# Patient Record
Sex: Female | Born: 1975 | Race: White | Hispanic: No | Marital: Single | Smoking: Current every day smoker
Health system: Southern US, Community
[De-identification: ages and names within clinical notes are randomized; demographics above are authoritative.]

## PROBLEM LIST (undated history)

## (undated) DIAGNOSIS — E876 Hypokalemia: Secondary | ICD-10-CM

## (undated) DIAGNOSIS — R202 Paresthesia of skin: Secondary | ICD-10-CM

## (undated) DIAGNOSIS — R200 Anesthesia of skin: Secondary | ICD-10-CM

## (undated) HISTORY — PX: TUBAL LIGATION: SHX77

## (undated) HISTORY — PX: ABDOMINAL HYSTERECTOMY: SHX81

---

## 2014-01-20 ENCOUNTER — Inpatient Hospital Stay (HOSPITAL_COMMUNITY)
Admission: AD | Admit: 2014-01-20 | Discharge: 2014-01-22 | DRG: 372 | Disposition: A | Discharge status: Home / Self Care | Payer: Self-pay | Source: Other Acute Inpatient Hospital | Attending: Internal Medicine | Admitting: Internal Medicine

## 2014-01-20 ENCOUNTER — Encounter (HOSPITAL_COMMUNITY): Payer: Self-pay | Admitting: *Deleted

## 2014-01-20 DIAGNOSIS — K838 Other specified diseases of biliary tract: Secondary | ICD-10-CM | POA: Diagnosis present

## 2014-01-20 DIAGNOSIS — K658 Other peritonitis: Principal | ICD-10-CM | POA: Diagnosis present

## 2014-01-20 DIAGNOSIS — F17200 Nicotine dependence, unspecified, uncomplicated: Secondary | ICD-10-CM | POA: Diagnosis present

## 2014-01-20 DIAGNOSIS — K929 Disease of digestive system, unspecified: Secondary | ICD-10-CM | POA: Diagnosis present

## 2014-01-20 DIAGNOSIS — R1011 Right upper quadrant pain: Secondary | ICD-10-CM

## 2014-01-20 DIAGNOSIS — R0902 Hypoxemia: Secondary | ICD-10-CM | POA: Diagnosis present

## 2014-01-20 DIAGNOSIS — K839 Disease of biliary tract, unspecified: Secondary | ICD-10-CM

## 2014-01-20 DIAGNOSIS — R932 Abnormal findings on diagnostic imaging of liver and biliary tract: Secondary | ICD-10-CM

## 2014-01-20 DIAGNOSIS — Y836 Removal of other organ (partial) (total) as the cause of abnormal reaction of the patient, or of later complication, without mention of misadventure at the time of the procedure: Secondary | ICD-10-CM | POA: Diagnosis present

## 2014-01-20 LAB — TROPONIN I: Troponin I: <0.3 ng/mL (ref <0.30)

## 2014-01-20 MED ORDER — ENOXAPARIN SODIUM 40 MG/0.4ML ~~LOC~~ SOLN
40.0000 mg | SUBCUTANEOUS | Status: DC
  Administered 2014-01-20 – 2014-01-21 (×2): 40 mg via SUBCUTANEOUS
  Filled 2014-01-20 (×3): qty 0.4

## 2014-01-20 MED ORDER — ONDANSETRON HCL 4 MG/2ML IJ SOLN
4.0000 mg | Freq: Four times a day (QID) | INTRAMUSCULAR | Status: DC | PRN
  Filled 2014-01-20: qty 2

## 2014-01-20 MED ORDER — HYDROMORPHONE HCL PF 1 MG/ML IJ SOLN
2.0000 mg | INTRAMUSCULAR | Status: DC | PRN
  Administered 2014-01-20 – 2014-01-21 (×3): 2 mg via INTRAVENOUS
  Administered 2014-01-21: 1 mg via INTRAVENOUS
  Administered 2014-01-21: 2 mg via INTRAVENOUS
  Filled 2014-01-20 (×7): qty 2

## 2014-01-20 MED ORDER — PIPERACILLIN-TAZOBACTAM 3.375 G IVPB
3.3750 g | Freq: Three times a day (TID) | INTRAVENOUS | Status: DC
  Administered 2014-01-20 – 2014-01-22 (×5): 3.375 g via INTRAVENOUS
  Filled 2014-01-20 (×8): qty 50

## 2014-01-20 MED ORDER — CETYLPYRIDINIUM CHLORIDE 0.05 % MT LIQD
7.0000 mL | Freq: Two times a day (BID) | OROMUCOSAL | Status: DC
  Administered 2014-01-21 – 2014-01-22 (×2): 7 mL via OROMUCOSAL

## 2014-01-20 MED ORDER — SODIUM CHLORIDE 0.9 % IV SOLN
INTRAVENOUS | Status: DC
  Administered 2014-01-20 – 2014-01-21 (×2): via INTRAVENOUS

## 2014-01-20 NOTE — Consult Note (Signed)
   Consultation   History of Present Illness:  This is a 38 yo WF  3 days  Post lap chole for symptomatic [facility]lithiasis at [facility], who presented  1 day after the discharge with recurrent RUQ abd.pain radiating into right shoulder. Nausea. No vomiting.She was evaluated at ED with findings of normal LFT's, WBC 21.000 and positive HIDA scan consistent with a generous bile leak along the right pericolic space. Because of no GI coverage on the weekends, she was [facility]/stent placement.She denies hx of hepatitis, jaundice.   No past medical history on file.no f hx of gall bladder disease No past surgical history on file.s/p lap chole 01/18/2014 smoker family history is not on file. No Known Allergies      Review of Systems:  The remainder of the 10 point ROS is negative except as outlined in H&P, rash upper extremities and lower legs   Physical Exam: General appearance  Well developed,obese, in mild distress., pain when she lay es down Eyes- non icteric. HEENT nontraumatic, normocephalic. Mouth no lesions, tongue papillated, no cheilosis. Neck supple without adenopathy, thyroid not enlarged, no carotid bruits, no JVD. Lungs Clear to auscultation bilaterally. Cor normal S1, normal S2,rapid  regular rhythm, no murmur,  quiet precordium. Abdomen: obese, soft, but positive rebound RUQ, decreased bowl sounds Rectal:not done Extremities no pedal edema. Skin excoriated rash both lower and upper extremities. Neurological alert and oriented x 3., in pain Psychological normal mood and affect.  Assessment and Plan: I have previewed the CT scan ,preoperative ultrasound and postoperative HIDA scan, which confirm post cholecystectomy bile leak of moderate size, causing bile peritonitis. Unfortunately it took 6 hours for the patient to [facility]  and arrived after the scheduled time in OR for ERCP/biliary stent placement under general anesthesia at  6.00 pm. We have rescheduled her procedure for 7.30 am in OR by Dt Outlaw, who has been briefed on the case. Continue antibiotics, pain control, bowl rest Morbid obesity Elevated WBC likely secondary to chemical peritonitis, r/o biloma, will have to watch for subhepatic  abscess, may need percutaneous drain to decompress bile collection.  I have personally reviewed the HIDA scan with Dr Kearney Hardover      01/20/2014 Kimberly Sarora Kimberly Sandoval

## 2014-01-20 NOTE — Progress Notes (Signed)
ANTIBIOTIC CONSULT NOTE - INITIAL  Pharmacy Consult for Zosyn Indication: intra-abdominal infection  No Known Allergies  Patient Measurements: Height: 5\' 4"  (162.6 cm) Weight: 222 lb 11.2 oz (101.016 kg) IBW/kg (Calculated) : 54.7  Vital Signs: Temp: 98.7 F (37.1 C) (08/15 1857) Temp src: Oral (08/15 1857) BP: 127/80 mmHg (08/15 1857) Pulse Rate: 95 (08/15 1857) Intake/Output from previous day:   Intake/Output from this shift:    Labs: No results found for this basename: WBC, HGB, PLT, LABCREA, CREATININE,  in the last 72 hours CrCl is unknown because no creatinine reading has been taken. No results found for this basename: VANCOTROUGH, VANCOPEAK, VANCORANDOM, GENTTROUGH, GENTPEAK, GENTRANDOM, TOBRATROUGH, TOBRAPEAK, TOBRARND, AMIKACINPEAK, AMIKACINTROU, AMIKACIN,  in the last 72 hours   Microbiology: No results found for this or any previous visit (from the past 720 hour(s)).  Medical History: No past medical history on file.  Medications:  Prescriptions prior to admission  Medication Sig Dispense Refill  . cephALEXin (KEFLEX) 500 MG capsule Take 500 mg by mouth 3 (three) times daily. For 7 days. Starting 01/19/2014      . HYDROcodone-acetaminophen (NORCO/VICODIN) 5-325 MG per tablet Take 1 tablet by mouth every 4 (four) hours as needed for moderate pain.      Marland Kitchen. Morphine Sulfate, PF, 10 MG/ML SOLN Inject 0.2 mLs into the vein every 2 (two) hours as needed (pain).      Marland Kitchen. olopatadine (PATANOL) 0.1 % ophthalmic solution Place 1 drop into the left eye every 3 (three) hours.       Assessment: 38 yo F [facility] and discharged home later that same day.  The next day she developed intense abd pain and [facility] and [facility]  CT scan there showed a RUQ pericolic fluid collection.  She is also s/p HIDA scan which showed large bile leak.  Pt was transferred [facility] for ERCP and stenting.  Labs from [facility]  report WBC 20K and normal SCr.  Goal of Therapy:  Eradication of infection  Renal dose adjustment of antibiotics  Plan:  Zosyn 3.375 gm IV q8h (4 hour infusion).   Toys 'R' UsKimberly Harris Penton, Pharm.D., BCPS Clinical Pharmacist Pager [phone] 01/20/2014 7:24 PM

## 2014-01-20 NOTE — Progress Notes (Signed)
MD notified that patient is having new type of pain rated at 10/10 in mid chest.  VS obtained.  MD ordered EKG, pain medicine, and cyclic troponins.  Patient RN made aware.  Will continue to monitor patient.

## 2014-01-20 NOTE — H&P (Signed)
[facility] History and Physical  Kimberly BooksVictoria Sandoval ZOX:[id number] DOB: [birth year] DOA: 01/20/2014  Referring physician:[facility] MD PCP: No PCP Per Patient   Chief Complaint: abd pain  HPI: Kimberly BooksVictoria Sandoval is a 38 y.o. female underwent Lap [facility] and was discharged home later the same day. She did have some abd soreness at discharge as expected. Yesterday started having severe RUQ abd pain that was extremely intense and went [facility] and [facility], she was noted to have some RUQ pericolic fluid collection on CT. Today she had a HIDA scan which showed a large bile leak and after discussion with GI, she was transferred her for ERCP and stenting. She has no other PMH and her complaints at this time include abd pain and nausea. Labs from [facility] with leukocytosis WBC of 20K    Review of Systems: positives bolded Constitutional:  No weight loss, night sweats, Fevers, chills, fatigue.  HEENT:  No headaches, Difficulty swallowing,Tooth/dental problems,Sore throat,  No sneezing, itching, ear ache, nasal congestion, post nasal drip,  Cardio-vascular:  No chest pain, Orthopnea, PND, swelling in lower extremities, anasarca, dizziness, palpitations  GI:  No heartburn, indigestion, abdominal pain, nausea, vomiting, diarrhea, change in bowel habits, loss of appetite  Resp:  No shortness of breath with exertion or at rest. No excess mucus, no productive cough, No non-productive cough, No coughing up of blood.No change in color of mucus.No wheezing.No chest wall deformity  Skin:  no rash or lesions.  GU:  no dysuria, change in color of urine, no urgency or frequency. No flank pain.  Musculoskeletal:  No joint pain or swelling. No decreased range of motion. No back pain.  Psych:  No change in mood or affect. No depression or anxiety. No memory loss.   No past medical history on file. No past surgical history on  file. Social History:  Smokes cigarettes, no alcohol, and drug history on file.  Allergies not on file  No family history on file.   Prior to Admission medications   Not on File   Physical Exam: Filed Vitals:   01/20/14 1802  Pulse: 95  TempSrc: Oral    Wt Readings from Last 3 Encounters:  No data found for Wt    General:  AAOx3, uncomfortable appearing Eyes: PERRL, normal lids, irises & conjunctiva ENT: grossly normal lips & tongue Neck: no LAD, masses or thyromegaly Cardiovascular: RRR, no m/r/g. No LE edema. Respiratory: CTA bilaterally, no w/r/r. Normal respiratory effort. Abdomen: obese, tender RUQ, no BS appreciated, no rigidity, mild rebound Skin: no rash or induration seen on limited exam Musculoskeletal: grossly normal tone BUE/BLE Psychiatric: grossly normal mood and affect, speech fluent and appropriate Neurologic: grossly non-focal.          Labs on Admission:  Basic Metabolic Panel: No results found for this basename: NA, K, CL, CO2, GLUCOSE, BUN, CREATININE, CALCIUM, MG, PHOS,  in the last 168 hours Liver Function Tests: No results found for this basename: AST, ALT, ALKPHOS, BILITOT, PROT, ALBUMIN,  in the last 168 hours No results found for this basename: LIPASE, AMYLASE,  in the last 168 hours No results found for this basename: AMMONIA,  in the last 168 hours CBC: No results found for this basename: WBC, NEUTROABS, HGB, HCT, MCV, PLT,  in the last 168 hours Cardiac Enzymes: No results found for this basename: CKTOTAL, CKMB, CKMBINDEX, TROPONINI,  in the last 168 hours  BNP (last 3 results) No results found for this  basename: PROBNP,  in the last 8760 hours CBG: No results found for this basename: GLUCAP,  in the last 168 hours  Radiological Exams on Admission: No results found.  Assessment/Plan Active Problems:   Abdominal pain, right upper quadrant   -s/p cholecystectomy 8/13   -Bile leak on HIDA at [facility] today   -GI consulted for ERCP  and stent in am (7:30am)   -NPO, pain control, IVF, Zosyn    Tobacco use disorder -counseled  Code Status: Full code DVT Prophylaxis: lovenox Family Communication: none at bedside Disposition Plan:  Home pending improvement  Time spent:  [facility] Pager [phone]  **Disclaimer: This note may have been dictated with voice recognition software. Similar sounding words can inadvertently be transcribed and this note may contain transcription errors which may not have been corrected upon publication of note.**

## 2014-01-20 NOTE — Progress Notes (Addendum)
Pt with complaints of chest pain rating 10/10. Describes pain as sharp and stabbing. Pt states pain radiating to left side.  Pt states sitting up helps to make her pain better. VS being taken and EKG being done.  Too soon to give pain medicine. Awaiting call back from physician on call. Spoke with provider on call Viyouh. Pt to get an EKG and cycle troponin q6 x 3. Pt states pain improved with sitting up.

## 2014-01-20 NOTE — Progress Notes (Signed)
[facility] arrived [facility]. Alert and oriented.  Patient oriented to unit  And falls protocol. Md paged traid [facility] admission paged to notify of patient arrival.

## 2014-01-21 ENCOUNTER — Encounter (HOSPITAL_COMMUNITY)
Admission: AD | Disposition: A | Discharge status: Home / Self Care | Payer: Self-pay | Source: Other Acute Inpatient Hospital | Attending: Internal Medicine

## 2014-01-21 ENCOUNTER — Inpatient Hospital Stay (HOSPITAL_COMMUNITY): Payer: Self-pay | Admitting: Anesthesiology

## 2014-01-21 ENCOUNTER — Encounter (HOSPITAL_COMMUNITY): Payer: Self-pay

## 2014-01-21 ENCOUNTER — Inpatient Hospital Stay (HOSPITAL_COMMUNITY): Payer: Self-pay

## 2014-01-21 ENCOUNTER — Encounter (HOSPITAL_COMMUNITY): Payer: Self-pay | Admitting: Anesthesiology

## 2014-01-21 HISTORY — PX: ERCP: SHX5425

## 2014-01-21 LAB — COMPREHENSIVE METABOLIC PANEL
ALBUMIN: 2.8 g/dL — AB (ref 3.5–5.2)
ALK PHOS: 83 U/L (ref 39–117)
ALT: 20 U/L (ref 0–35)
ANION GAP: 10 (ref 5–15)
AST: 16 U/L (ref 0–37)
BILIRUBIN TOTAL: 1 mg/dL (ref 0.3–1.2)
BUN: 7 mg/dL (ref 6–23)
CHLORIDE: 100 meq/L (ref 96–112)
CO2: 27 mEq/L (ref 19–32)
Calcium: 8.7 mg/dL (ref 8.4–10.5)
Creatinine, Ser: 0.6 mg/dL (ref 0.50–1.10)
GFR calc Af Amer: >90 mL/min (ref >90)
GFR calc non Af Amer: >90 mL/min (ref >90)
Glucose, Bld: 108 mg/dL — ABNORMAL HIGH (ref 70–99)
POTASSIUM: 4.2 meq/L (ref 3.7–5.3)
SODIUM: 137 meq/L (ref 137–147)
TOTAL PROTEIN: 6.8 g/dL (ref 6.0–8.3)

## 2014-01-21 LAB — PROTIME-INR
INR: 1.16 (ref 0.00–1.49)
PROTHROMBIN TIME: 14.8 s (ref 11.6–15.2)

## 2014-01-21 LAB — CBC
HCT: 38.4 % (ref 36.0–46.0)
HEMOGLOBIN: 11.9 g/dL — AB (ref 12.0–15.0)
MCH: 28.7 pg (ref 26.0–34.0)
MCHC: 31 g/dL (ref 30.0–36.0)
MCV: 92.8 fL (ref 78.0–100.0)
PLATELETS: 405 10*3/uL — AB (ref 150–400)
RBC: 4.14 MIL/uL (ref 3.87–5.11)
RDW: 15.7 % — ABNORMAL HIGH (ref 11.5–15.5)
WBC: 16.4 10*3/uL — AB (ref 4.0–10.5)

## 2014-01-21 LAB — TROPONIN I: Troponin I: <0.3 ng/mL (ref <0.30)

## 2014-01-21 SURGERY — ERCP, WITH INTERVENTION IF INDICATED
Anesthesia: General

## 2014-01-21 MED ORDER — MIDAZOLAM HCL 2 MG/2ML IJ SOLN
INTRAMUSCULAR | Status: DC | PRN
  Administered 2014-01-21: 2 mg via INTRAVENOUS

## 2014-01-21 MED ORDER — ONDANSETRON HCL 4 MG/2ML IJ SOLN
INTRAMUSCULAR | Status: DC | PRN
  Administered 2014-01-21: 4 mg via INTRAVENOUS

## 2014-01-21 MED ORDER — OXYCODONE HCL 5 MG/5ML PO SOLN: 5.0000 mg | Freq: Once | ORAL | Status: AC | PRN

## 2014-01-21 MED ORDER — SODIUM CHLORIDE 0.9 % IV SOLN: INTRAVENOUS | Status: DC

## 2014-01-21 MED ORDER — GLYCOPYRROLATE 0.2 MG/ML IJ SOLN
INTRAMUSCULAR | Status: DC | PRN
  Administered 2014-01-21: 0.6 mg via INTRAVENOUS

## 2014-01-21 MED ORDER — PROPOFOL 10 MG/ML IV BOLUS
INTRAVENOUS | Status: DC | PRN
  Administered 2014-01-21: 130 mg via INTRAVENOUS

## 2014-01-21 MED ORDER — ROCURONIUM BROMIDE 100 MG/10ML IV SOLN
INTRAVENOUS | Status: DC | PRN
  Administered 2014-01-21: 25 mg via INTRAVENOUS

## 2014-01-21 MED ORDER — OXYCODONE HCL 5 MG PO TABS
5.0000 mg | ORAL_TABLET | Freq: Once | ORAL | Status: AC | PRN
  Administered 2014-01-21: 5 mg via ORAL
  Filled 2014-01-21: qty 1

## 2014-01-21 MED ORDER — LIDOCAINE HCL (CARDIAC) 20 MG/ML IV SOLN
INTRAVENOUS | Status: DC | PRN
  Administered 2014-01-21: 60 mg via INTRAVENOUS

## 2014-01-21 MED ORDER — HYDROMORPHONE HCL PF 1 MG/ML IJ SOLN: 0.2500 mg | INTRAMUSCULAR | Status: DC | PRN

## 2014-01-21 MED ORDER — NEOSTIGMINE METHYLSULFATE 10 MG/10ML IV SOLN
INTRAVENOUS | Status: DC | PRN
  Administered 2014-01-21: 3 mg via INTRAVENOUS

## 2014-01-21 MED ORDER — LACTATED RINGERS IV SOLN
INTRAVENOUS | Status: DC | PRN
  Administered 2014-01-21: 08:00:00 via INTRAVENOUS

## 2014-01-21 MED ORDER — MIDAZOLAM HCL 2 MG/2ML IJ SOLN
INTRAMUSCULAR | Status: AC
  Filled 2014-01-21: qty 2

## 2014-01-21 MED ORDER — FENTANYL CITRATE 0.05 MG/ML IJ SOLN
INTRAMUSCULAR | Status: AC
  Filled 2014-01-21: qty 5

## 2014-01-21 MED ORDER — FENTANYL CITRATE 0.05 MG/ML IJ SOLN
INTRAMUSCULAR | Status: DC | PRN
  Administered 2014-01-21: 100 ug via INTRAVENOUS
  Administered 2014-01-21: 150 ug via INTRAVENOUS

## 2014-01-21 MED ORDER — SUCCINYLCHOLINE CHLORIDE 20 MG/ML IJ SOLN
INTRAMUSCULAR | Status: DC | PRN
  Administered 2014-01-21: 100 mg via INTRAVENOUS

## 2014-01-21 MED ORDER — IOHEXOL 300 MG/ML  SOLN
INTRAMUSCULAR | Status: DC | PRN
  Administered 2014-01-21: 08:00:00

## 2014-01-21 NOTE — Anesthesia Postprocedure Evaluation (Signed)
  Anesthesia Post-op Note  Patient: Kimberly BooksVictoria Sandoval  Procedure(s) Performed: Procedure(s): ENDOSCOPIC RETROGRADE CHOLANGIOPANCREATOGRAPHY (ERCP) (N/A)  Patient Location: PACU  Anesthesia Type:General  Level of Consciousness: awake and alert   Airway and Oxygen Therapy: Patient Spontanous Breathing  Post-op Pain: none  Post-op Assessment: Post-op Vital signs reviewed, Patient's Cardiovascular Status Stable and Respiratory Function Stable  Post-op Vital Signs: Reviewed  Filed Vitals:   01/21/14 0909  BP: 114/60  Pulse: 78  Temp:   Resp: 21    Complications: No apparent anesthesia complications

## 2014-01-21 NOTE — Anesthesia Procedure Notes (Signed)
Procedure Name: Intubation Date/Time: 01/21/2014 7:51 AM Performed by: Alanda AmassFRIEDMAN, Daniela Hernan A Pre-anesthesia Checklist: Patient identified, Patient being monitored, Timeout performed, Emergency Drugs available and Suction available Patient Re-evaluated:Patient Re-evaluated prior to inductionOxygen Delivery Method: Circle system utilized Preoxygenation: Pre-oxygenation with 100% oxygen Intubation Type: IV induction Ventilation: Mask ventilation without difficulty Laryngoscope Size: Mac and 3 Grade View: Grade I Tube type: Oral Tube size: 7.5 mm Number of attempts: 1 Airway Equipment and Method: Stylet Placement Confirmation: ETT inserted through vocal cords under direct vision,  breath sounds checked- equal and bilateral and positive ETCO2 Secured at: 21 cm Tube secured with: Tape Dental Injury: Teeth and Oropharynx as per pre-operative assessment

## 2014-01-21 NOTE — Progress Notes (Signed)
Jennings BooksVictoria Tibbett 7:40 AM  Subjective: Patient seen and examined and photocopied notes reviewed and currently her pain is under okay control and no new complaints  Objective: Vital signs stable afebrile no acute distress exam pertinent for no leaking from scars but upper abdominal discomfort left greater than right nontender lower other exam please see pre-assessment evaluation labs reviewed increased CT and HIDA scan reports reviewed  Assessment: Bile leak status post laparoscopic cholecystectomy  Plan: The risks benefits methods and success rate of ERCP with stent placement was discussed with the patient and will proceed this morning with anesthesia assistance with further workup and plans pending those findings  [facility],Narissa Beaufort E

## 2014-01-21 NOTE — Anesthesia Preprocedure Evaluation (Addendum)
Anesthesia Evaluation  Patient identified by MRN, date of birth, ID band Patient awake    Reviewed: Allergy & Precautions, H&P , NPO status , Patient's Chart, lab work & pertinent test results  Airway Mallampati: II TM Distance: >3 FB Neck ROM: Full    Dental no notable dental hx. (+) Poor Dentition, Dental Advisory Given, Edentulous Upper   Pulmonary Current Smoker,  breath sounds clear to auscultation  Pulmonary exam normal       Cardiovascular negative cardio ROS  Rhythm:Regular Rate:Normal     Neuro/Psych negative neurological ROS  negative psych ROS   GI/Hepatic negative GI ROS, Neg liver ROS,   Endo/Other  negative endocrine ROS  Renal/GU negative Renal ROS  negative genitourinary   Musculoskeletal   Abdominal   Peds  Hematology negative hematology ROS (+)   Anesthesia Other Findings   Reproductive/Obstetrics negative OB ROS                          Anesthesia Physical Anesthesia Plan  ASA: II  Anesthesia Plan: General   Post-op Pain Management:    Induction: Intravenous  Airway Management Planned: Oral ETT  Additional Equipment:   Intra-op Plan:   Post-operative Plan: Extubation in OR  Informed Consent: I have reviewed the patients History and Physical, chart, labs and discussed the procedure including the risks, benefits and alternatives for the proposed anesthesia with the patient or authorized representative who has indicated his/her understanding and acceptance.   Dental advisory given  Plan Discussed with: CRNA  Anesthesia Plan Comments:         Anesthesia Quick Evaluation

## 2014-01-21 NOTE — Transfer of Care (Signed)
Immediate Anesthesia Transfer of Care Note  Patient: Kimberly BooksVictoria Sandoval  Procedure(s) Performed: Procedure(s): ENDOSCOPIC RETROGRADE CHOLANGIOPANCREATOGRAPHY (ERCP) (N/A)  Patient Location: PACU  Anesthesia Type:General  Level of Consciousness: awake and alert   Airway & Oxygen Therapy: Patient Spontanous Breathing and Patient connected to face mask oxygen  Post-op Assessment: Report given to PACU RN and Post -op Vital signs reviewed and stable  Post vital signs: Reviewed and stable  Complications: No apparent anesthesia complications

## 2014-01-21 NOTE — Progress Notes (Signed)
[facility] PROGRESS NOTE  Kimberly BooksVictoria Sandoval [id number] DOB: 18-Dec-1975 DOA: 01/20/2014 PCP: No PCP Per Patient  Assessment/Plan: Bile Leak s/p cholecystectomy 8/13  -s/p ERCP and stenting today -clears, empiric Zosyn, IVF -Cmet, CBC in am -improving   Hypoxia -smokes 1-2PPD x20years -suspect splinting/atx due to abd pain -check CXR -Wean O2   Tobacco use disorder  -counseled  DVT proph: lovenox  Code Status: Full Code Family Communication: none at bedside Disposition Plan: home when stable   Consultants:  GI  Procedures:  ERCP and stent  HPI/Subjective: abd pain better, now on 4L O2 from PACU  Objective: Filed Vitals:   01/21/14 1013  BP: 111/74  Pulse: 101  Temp: 97.5 F (36.4 C)  Resp: 22    Intake/Output Summary (Last 24 hours) at 01/21/14 1124 Last data filed at 01/21/14 0829  Gross per 24 hour  Intake   1510 ml  Output      0 ml  Net   1510 ml   Filed Weights   01/20/14 1857  Weight: 101.016 kg (222 lb 11.2 oz)    Exam:   General:  AAOx3, no distress  Cardiovascular: S1S2/RRR  Respiratory: crackles at bases, poor air movement  Abdomen: soft, obese, RUQ tender, BS diminished but present  Musculoskeletal: no edema c/c  Data Reviewed: Basic Metabolic Panel:  Recent Labs Lab 01/21/14 0600  NA 137  K 4.2  CL 100  CO2 27  GLUCOSE 108*  BUN 7  CREATININE 0.60  CALCIUM 8.7   Liver Function Tests:  Recent Labs Lab 01/21/14 0600  AST 16  ALT 20  ALKPHOS 83  BILITOT 1.0  PROT 6.8  ALBUMIN 2.8*   No results found for this basename: LIPASE, AMYLASE,  in the last 168 hours No results found for this basename: AMMONIA,  in the last 168 hours CBC:  Recent Labs Lab 01/21/14 0600  WBC 16.4*  HGB 11.9*  HCT 38.4  MCV 92.8  PLT 405*   Cardiac Enzymes:  Recent Labs Lab 01/20/14 2200 01/21/14 0600  TROPONINI <0.30 <0.30   BNP (last 3 results) No results found for this basename: PROBNP,  in the last 8760  hours CBG: No results found for this basename: GLUCAP,  in the last 168 hours  No results found for this or any previous visit (from the past 240 hour(s)).   Studies: Dg Ercp Biliary & Pancreatic Ducts  01/21/2014   CLINICAL DATA:  Common bile duct leak  EXAM: ERCP  TECHNIQUE: Multiple spot images obtained with the fluoroscopic device and submitted for interpretation post-procedure.  COMPARISON:  None.  FINDINGS: Images document cannulation of the common bile duct and placement of a common bile duct stent.  IMPRESSION: See above.  These images were submitted for radiologic interpretation only. Please see the procedural report for the amount of contrast and the fluoroscopy time utilized.   Electronically Signed   By: Maryclare BeanArt  Hoss M.D.   On: 01/21/2014 09:05    Scheduled Meds: . antiseptic oral rinse  7 mL Mouth Rinse BID  . enoxaparin (LOVENOX) injection  40 mg Subcutaneous Q24H  . piperacillin-tazobactam (ZOSYN)  IV  3.375 g Intravenous 3 times per day   Continuous Infusions: . sodium chloride 100 mL/hr at 01/20/14 1923   Antibiotics Given (last 72 hours)   Date/Time Action Medication Dose Rate   01/20/14 2214 Given   piperacillin-tazobactam (ZOSYN) IVPB 3.375 g 3.375 g 12.5 mL/hr   01/21/14 0548 Given   piperacillin-tazobactam (ZOSYN) IVPB 3.375 g  3.375 g 12.5 mL/hr      Active Problems:   Abdominal pain, right upper quadrant   Bile leak   Tobacco use disorder   Nonspecific (abnormal) findings on radiological and other examination of biliary tract    Time spent:    [facility]  [facility] Pager [phone]. If 7PM-7AM, please contact night-coverage at [email or web address], password [facility] 01/21/2014, 11:24 AM  LOS: 1 day

## 2014-01-21 NOTE — Progress Notes (Signed)
Pr underwent ERCP/shincterotomy and stent placement by DR Magod this morning. I will try to locate the report under Procedures.

## 2014-01-22 ENCOUNTER — Encounter (HOSPITAL_COMMUNITY): Payer: Self-pay | Admitting: Gastroenterology

## 2014-01-22 LAB — CBC WITH DIFFERENTIAL/PLATELET
BASOS PCT: 0 % (ref 0–1)
Basophils Absolute: 0 10*3/uL (ref 0.0–0.1)
EOS ABS: 0.5 10*3/uL (ref 0.0–0.7)
EOS PCT: 3 % (ref 0–5)
HEMATOCRIT: 33.2 % — AB (ref 36.0–46.0)
Hemoglobin: 10.4 g/dL — ABNORMAL LOW (ref 12.0–15.0)
Lymphocytes Relative: 16 % (ref 12–46)
Lymphs Abs: 2.5 10*3/uL (ref 0.7–4.0)
MCH: 28.4 pg (ref 26.0–34.0)
MCHC: 31.3 g/dL (ref 30.0–36.0)
MCV: 90.7 fL (ref 78.0–100.0)
MONO ABS: 0.8 10*3/uL (ref 0.1–1.0)
Monocytes Relative: 5 % (ref 3–12)
Neutro Abs: 11.9 10*3/uL — ABNORMAL HIGH (ref 1.7–7.7)
Neutrophils Relative %: 76 % (ref 43–77)
Platelets: 399 10*3/uL (ref 150–400)
RBC: 3.66 MIL/uL — ABNORMAL LOW (ref 3.87–5.11)
RDW: 15.5 % (ref 11.5–15.5)
WBC: 15.7 10*3/uL — ABNORMAL HIGH (ref 4.0–10.5)

## 2014-01-22 LAB — COMPREHENSIVE METABOLIC PANEL
ALBUMIN: 2.6 g/dL — AB (ref 3.5–5.2)
ALT: 14 U/L (ref 0–35)
ANION GAP: 9 (ref 5–15)
AST: 12 U/L (ref 0–37)
Alkaline Phosphatase: 75 U/L (ref 39–117)
BILIRUBIN TOTAL: 0.8 mg/dL (ref 0.3–1.2)
BUN: 6 mg/dL (ref 6–23)
CO2: 28 mEq/L (ref 19–32)
CREATININE: 0.6 mg/dL (ref 0.50–1.10)
Calcium: 8.5 mg/dL (ref 8.4–10.5)
Chloride: 100 mEq/L (ref 96–112)
GFR calc Af Amer: >90 mL/min (ref >90)
GFR calc non Af Amer: >90 mL/min (ref >90)
Glucose, Bld: 92 mg/dL (ref 70–99)
Potassium: 3.8 mEq/L (ref 3.7–5.3)
Sodium: 137 mEq/L (ref 137–147)
TOTAL PROTEIN: 6.4 g/dL (ref 6.0–8.3)

## 2014-01-22 MED ORDER — CIPROFLOXACIN HCL 500 MG PO TABS: 500.0000 mg | ORAL_TABLET | Freq: Two times a day (BID) | ORAL | Status: DC

## 2014-01-22 MED ORDER — METRONIDAZOLE 500 MG PO TABS: 500.0000 mg | ORAL_TABLET | Freq: Three times a day (TID) | ORAL | Status: DC

## 2014-01-22 NOTE — Progress Notes (Signed)
Kimberly Sandoval 11:00 AM  Subjective: Patient doing significantly better and only hurts around her incisions and is tolerating soft solid and is moving her bowels and has no new complaints and no obvious post procedural complications  Objective: Vital signs stable afebrile no acute distress doing obviously significantly better abdomen is soft and much less tender only over incision labs improved  Assessment: Status post ERCP for bile leak doing much better  Plan: Okay with me to go home and have surgeon recheck labs locally on followup and since she lives out of town I will see her back when necessary and she will call my nurse to set up stent removal which we discussed in roughly 2-3 months and I'm happy to see her back if need be and I gave her my card [facility],Kimberly Sandoval

## 2014-01-22 NOTE — Progress Notes (Signed)
Unassigned patient' Subjective: Events since admission noted. Patient has been feeling much better since she had her ERCP. Tolerated her breakfast today-French toast; denies having any nausea, vomiting or abdominal pain. Had a BM this morning.   Objective: Vital signs in last 24 hours: Temp:  [98.2 F (36.8 C)-98.6 F (37 C)] 98.5 F (36.9 C) (08/17 0529) Pulse Rate:  [90-111] 90 (08/17 0529) Resp:  [19-24] 19 (08/17 0529) BP: (109-113)/(69-73) 110/70 mmHg (08/17 0529) SpO2:  [91 %-96 %] 94 % (08/17 0845) Last BM Date: 01/22/14  Intake/Output from previous day: 08/16 0701 - 08/17 0700 In: 350 [I.V.:350] Out: -  Intake/Output this shift: Total I/O In: 118 [P.O.:118] Out: -   General appearance: alert, cooperative, appears older than stated age, no distress and morbidly obese Resp: clear to auscultation bilaterally Cardio: regular rate and rhythm, S1, S2 normal, no murmur, click, rub or gallop GI: soft, non-tender; bowel sounds normal; no masses,  no organomegaly Extremities: extremities normal, atraumatic, no cyanosis or edema  Lab Results:  Recent Labs  01/21/14 0600 01/22/14 0535  WBC 16.4* 15.7*  HGB 11.9* 10.4*  HCT 38.4 33.2*  PLT 405* 399   BMET  Recent Labs  01/21/14 0600 01/22/14 0535  NA 137 137  K 4.2 3.8  CL 100 100  CO2 27 28  GLUCOSE 108* 92  BUN 7 6  CREATININE 0.60 0.60  CALCIUM 8.7 8.5   LFT  Recent Labs  01/22/14 0535  PROT 6.4  ALBUMIN 2.6*  AST 12  ALT 14  ALKPHOS 75  BILITOT 0.8   PT/INR  Recent Labs  01/21/14 0600  LABPROT 14.8  INR 1.16   Studies/Results: Dg Chest 2 View  01/21/2014   CLINICAL DATA:  Hypoxia.  EXAM: CHEST  2 VIEW  COMPARISON:  None.  FINDINGS: Low lung volumes. There is lung base opacity most likely atelectasis. Infiltrate is possible. There is no pulmonary edema or convincing pleural effusion. No pneumothorax.  Cardiac silhouette is normal in size. Normal mediastinal and hilar contours.  Bony thorax  is unremarkable.  IMPRESSION: Low lung volumes with bilateral lung base opacity, most likely atelectasis. Infiltrate should be considered likely if there are symptoms of pneumonia. No pulmonary edema.   Electronically Signed   By: Amie Portlandavid  Ormond M.D.   On: 01/21/2014 12:43   Dg Ercp Biliary & Pancreatic Ducts  01/21/2014   CLINICAL DATA:  Common bile duct leak  EXAM: ERCP  TECHNIQUE: Multiple spot images obtained with the fluoroscopic device and submitted for interpretation post-procedure.  COMPARISON:  None.  FINDINGS: Images document cannulation of the common bile duct and placement of a common bile duct stent.  IMPRESSION: See above.  These images were submitted for radiologic interpretation only. Please see the procedural report for the amount of contrast and the fluoroscopy time utilized.   Electronically Signed   By: Maryclare BeanArt  Hoss M.D.   On: 01/21/2014 09:05   Medications: I have reviewed the patient's current medications.  Assessment/Plan: *Bile leak s/p cholecystectomy for cholelithiasis: Patient seems to be doing well after her ERCP. She seems ready for discharge. A low fat diet has been advised. She is to follow up with her PCP in 7-10 days.   LOS: 2 days   Holland Nickson 01/22/2014, 11:33 AM

## 2014-01-22 NOTE — Progress Notes (Signed)
PIV removed.  Pt given discharge paperwork and requested to be helped with the cost of medication.  CM assisted pt with paperwork.  Returned to check on pt to assist with discharge to the main entrance, but pt was not in room when checked.  Received phone call that [facility].  Informed caller that pt had been discharged, but did not know she left alone.  [facility].

## 2014-01-22 NOTE — Progress Notes (Signed)
Utilization review completed.  

## 2014-01-22 NOTE — Progress Notes (Signed)
CARE MANAGEMENT NOTE 01/22/2014  Patient:  Kimberly Sandoval,Kimberly Sandoval   Account Number:  [id number]  Date Initiated:  01/22/2014  Documentation [facility]:  [facility]Avner Stroder  Subjective/Objective Assessment:   large bile leak     Action/Plan:   Anticipated DC Date:  01/22/2014   Anticipated DC Plan:  HOME/SELF CARE      DC Planning Services  CM consult  Medication Assistance  MATCH Program      Choice offered to / List presented to:             Status of service:  Completed, signed off Medicare Important Message given?   (If response is "NO", the following Medicare IM given date fields will be blank) Date Medicare IM given:   Medicare IM given by:   Date Additional Medicare IM given:   Additional Medicare IM given by:    Discharge Disposition:  HOME/SELF CARE  Per UR Regulation:    If discussed at Long Length of Stay Meetings, dates discussed:    Comments:  01/22/2014 1600 NCM spoke to pt and states she will not be able to purchase meds. Provided pt with MATCH with copay for meds $3.00. Explained to pt program can be used once per yr. Pt [facility], Dr. Janeece FittingEggleston-Clark. Scheduled appt for 01/29/2014 at 1015 am. Faxed dc summary to Dr. Verlee MonteEggleston-Clark's office, # [phone] and fax [phone]. Isidoro DonningAlesia Miko Markwood RN CCM Case Mgmt phone [phone]

## 2014-01-22 NOTE — Discharge Summary (Signed)
Physician Discharge Summary  Kimberly Sandoval ONG:[id number] DOB: 1975/08/09 DOA: 01/20/2014  PCP: No PCP Per Patient  Admit date: 01/20/2014 Discharge date: 01/22/2014  Time spent: 35 minutes  Recommendations for Outpatient Follow-up:  1. PCP in 1 week 2. Dr.Magod in 2-34months for stent removal  Discharge Diagnoses:  Active Problems:   Abdominal pain, right upper quadrant   Bile leak   Tobacco use disorder   Nonspecific (abnormal) findings on radiological and other examination of biliary tract   Hypoxia  Discharge Condition: stable  Diet recommendation: bland  Filed Weights   01/20/14 1857  Weight: 101.016 kg (222 lb 11.2 oz)    History of present illness:  Kimberly Sandoval is a 38 y.o. female underwent Lap cholecystectomy on 8/13 at [facility] and was discharged home later the same day.  She did have some abd soreness at discharge as expected.  8/14 started having severe RUQ abd pain that was extremely intense and went to [facility] ER and was transferred to [facility], she was noted to have some RUQ pericolic fluid collection on CT.  8/15 she had a HIDA scan which showed a large bile leak and after discussion with GI, she was transferred her for ERCP and stenting.  She has no other PMH and her complaints at this time include abd pain and nausea.  Labs from [place] with leukocytosis WBC of 20K  Hospital Course:  Bile Leak s/p cholecystectomy 8/13  -s/p ERCP and stenting 8/16 -treated with empiric Zosyn, IVF  -improved and advised to Fu with Dr.Magod in 2-85months for stent removal  Hypoxia  -smokes 1-2PPD x20years  -due to splinting/atx due to abd pain  -CXR unremarkable -O2 being weaned off  Tobacco use disorder  -counseled   Procedures: ERCP and stenting  Consultations:  GI  Discharge Exam: Filed Vitals:   01/22/14 0529  BP: 110/70  Pulse: 90  Temp: 98.5 F (36.9 C)  Resp: 19    General: AAOx3 Cardiovascular: S1S2/RRR Respiratory:  CTAB  Discharge Instructions You were cared for by a hospitalist during your hospital stay. If you have any questions about your discharge medications or the care you received while you were in the hospital after you are discharged, you can call the unit and asked to speak with the hospitalist on call if the hospitalist that took care of you is not available. Once you are discharged, your primary care physician will handle any further medical issues. Please note that NO REFILLS for any discharge medications will be authorized once you are discharged, as it is imperative that you return to your primary care physician (or establish a relationship with a primary care physician if you do not have one) for your aftercare needs so that they can reassess your need for medications and monitor your lab values.  Discharge Instructions   Discharge instructions    Complete by:  As directed   Bland diet     Increase activity slowly    Complete by:  As directed             Medication List    STOP taking these medications       cephALEXin 500 MG capsule  Commonly known as:  KEFLEX     Morphine Sulfate (PF) 10 MG/ML Soln     olopatadine 0.1 % ophthalmic solution  Commonly known as:  PATANOL      TAKE these medications       HYDROcodone-acetaminophen 5-325 MG per tablet  Commonly known as:  NORCO/VICODIN  Take 1 tablet by mouth every 4 (four) hours as needed for moderate pain.       No Known Allergies    The results of significant diagnostics from this hospitalization (including imaging, microbiology, ancillary and laboratory) are listed below for reference.    Significant Diagnostic Studies: Dg Chest 2 View  01/21/2014   CLINICAL DATA:  Hypoxia.  EXAM: CHEST  2 VIEW  COMPARISON:  None.  FINDINGS: Low lung volumes. There is lung base opacity most likely atelectasis. Infiltrate is possible. There is no pulmonary edema or convincing pleural effusion. No pneumothorax.  Cardiac silhouette is  normal in size. Normal mediastinal and hilar contours.  Bony thorax is unremarkable.  IMPRESSION: Low lung volumes with bilateral lung base opacity, most likely atelectasis. Infiltrate should be considered likely if there are symptoms of pneumonia. No pulmonary edema.   Electronically Signed   By: Amie Portlandavid  Ormond M.D.   On: 01/21/2014 12:43   Dg Ercp Biliary & Pancreatic Ducts  01/21/2014   CLINICAL DATA:  Common bile duct leak  EXAM: ERCP  TECHNIQUE: Multiple spot images obtained with the fluoroscopic device and submitted for interpretation post-procedure.  COMPARISON:  None.  FINDINGS: Images document cannulation of the common bile duct and placement of a common bile duct stent.  IMPRESSION: See above.  These images were submitted for radiologic interpretation only. Please see the procedural report for the amount of contrast and the fluoroscopy time utilized.   Electronically Signed   By: Maryclare BeanArt  Hoss M.D.   On: 01/21/2014 09:05    Microbiology: No results found for this or any previous visit (from the past 240 hour(s)).   Labs: Basic Metabolic Panel:  Recent Labs Lab 01/21/14 0600 01/22/14 0535  NA 137 137  K 4.2 3.8  CL 100 100  CO2 27 28  GLUCOSE 108* 92  BUN 7 6  CREATININE 0.60 0.60  CALCIUM 8.7 8.5   Liver Function Tests:  Recent Labs Lab 01/21/14 0600 01/22/14 0535  AST 16 12  ALT 20 14  ALKPHOS 83 75  BILITOT 1.0 0.8  PROT 6.8 6.4  ALBUMIN 2.8* 2.6*   No results found for this basename: LIPASE, AMYLASE,  in the last 168 hours No results found for this basename: AMMONIA,  in the last 168 hours CBC:  Recent Labs Lab 01/21/14 0600 01/22/14 0535  WBC 16.4* 15.7*  NEUTROABS  --  11.9*  HGB 11.9* 10.4*  HCT 38.4 33.2*  MCV 92.8 90.7  PLT 405* 399   Cardiac Enzymes:  Recent Labs Lab 01/20/14 2200 01/21/14 0600  TROPONINI <0.30 <0.30   BNP: BNP (last 3 results) No results found for this basename: PROBNP,  in the last 8760 hours CBG: No results found for  this basename: GLUCAP,  in the last 168 hours     Signed:  Antwion Carpenter  [facility] 01/22/2014, 11:55 AM

## 2014-01-22 NOTE — Discharge Instructions (Signed)

## 2014-04-18 ENCOUNTER — Encounter (HOSPITAL_COMMUNITY): Payer: Self-pay | Admitting: *Deleted

## 2014-04-23 ENCOUNTER — Other Ambulatory Visit: Payer: Self-pay | Admitting: Gastroenterology

## 2014-04-23 NOTE — Addendum Note (Signed)
Addended byVida Rigger: Shalayah Beagley on: 04/23/2014 02:54 PM   Modules accepted: Orders

## 2014-04-26 ENCOUNTER — Encounter (HOSPITAL_COMMUNITY): Payer: Self-pay | Admitting: Anesthesiology

## 2014-04-26 NOTE — Progress Notes (Addendum)
Patient can not get a ride is going to reschedule.  I instructed her to call Dr Marlane HatcherMagod's office.

## 2014-04-26 NOTE — Progress Notes (Signed)
I spoke with Dr Marlane HatcherMagod's scheduler   , she reports that she has noted been notified by patient that she is not having procedure.  I called patient again and she again reported that she can not make it tomorrow, I reminded patient to call Dr Marlane HatcherMagod's office, "I will, I just have not had time yet."

## 2014-04-27 ENCOUNTER — Ambulatory Visit (HOSPITAL_COMMUNITY): Admission: RE | Admit: 2014-04-27 | Payer: Self-pay | Source: Ambulatory Visit | Admitting: Gastroenterology

## 2014-04-27 HISTORY — DX: Hypokalemia: E87.6

## 2014-04-27 SURGERY — ESOPHAGOGASTRODUODENOSCOPY (EGD) WITH PROPOFOL
Anesthesia: Monitor Anesthesia Care

## 2014-05-30 ENCOUNTER — Encounter (HOSPITAL_COMMUNITY): Payer: Self-pay | Admitting: *Deleted

## 2014-06-13 ENCOUNTER — Other Ambulatory Visit: Payer: Self-pay | Admitting: Gastroenterology

## 2014-06-13 NOTE — Anesthesia Preprocedure Evaluation (Addendum)
Anesthesia Evaluation  Patient identified by MRN, date of birth, ID band Patient awake    Reviewed: Allergy & Precautions, NPO status , Patient's Chart, lab work & pertinent test results  History of Anesthesia Complications Negative for: history of anesthetic complications  Airway Mallampati: II  TM Distance: >3 FB Neck ROM: Full    Dental no notable dental hx. (+) Dental Advisory Given, Poor Dentition, Missing   Pulmonary Current Smoker,  breath sounds clear to auscultation  Pulmonary exam normal       Cardiovascular negative cardio ROS  Rhythm:Regular Rate:Normal     Neuro/Psych PSYCHIATRIC DISORDERS Anxiety Depression negative neurological ROS     GI/Hepatic negative GI ROS, Neg liver ROS,   Endo/Other  obesity  Renal/GU negative Renal ROS  negative genitourinary   Musculoskeletal negative musculoskeletal ROS (+)   Abdominal   Peds negative pediatric ROS (+)  Hematology negative hematology ROS (+)   Anesthesia Other Findings   Reproductive/Obstetrics negative OB ROS                            Anesthesia Physical Anesthesia Plan  ASA: II  Anesthesia Plan: MAC   Post-op Pain Management:    Induction: Intravenous  Airway Management Planned: Nasal Cannula  Additional Equipment:   Intra-op Plan:   Post-operative Plan: Extubation in OR  Informed Consent: I have reviewed the patients History and Physical, chart, labs and discussed the procedure including the risks, benefits and alternatives for the proposed anesthesia with the patient or authorized representative who has indicated his/her understanding and acceptance.   Dental advisory given  Plan Discussed with: CRNA  Anesthesia Plan Comments:         Anesthesia Quick Evaluation

## 2014-06-14 ENCOUNTER — Encounter (HOSPITAL_COMMUNITY): Payer: Self-pay | Admitting: *Deleted

## 2014-06-14 ENCOUNTER — Ambulatory Visit (HOSPITAL_COMMUNITY)
Admission: RE | Admit: 2014-06-14 | Discharge: 2014-06-14 | Disposition: A | Discharge status: Home / Self Care | Payer: Self-pay | Source: Ambulatory Visit | Attending: Gastroenterology | Admitting: Gastroenterology

## 2014-06-14 ENCOUNTER — Encounter (HOSPITAL_COMMUNITY)
Admission: RE | Disposition: A | Discharge status: Home / Self Care | Payer: Self-pay | Source: Ambulatory Visit | Attending: Gastroenterology

## 2014-06-14 ENCOUNTER — Ambulatory Visit (HOSPITAL_COMMUNITY): Payer: Self-pay | Admitting: Anesthesiology

## 2014-06-14 DIAGNOSIS — Z4689 Encounter for fitting and adjustment of other specified devices: Secondary | ICD-10-CM | POA: Insufficient documentation

## 2014-06-14 HISTORY — DX: Anesthesia of skin: R20.0

## 2014-06-14 HISTORY — DX: Paresthesia of skin: R20.2

## 2014-06-14 HISTORY — PX: ESOPHAGOGASTRODUODENOSCOPY (EGD) WITH PROPOFOL: SHX5813

## 2014-06-14 HISTORY — PX: GASTROINTESTINAL STENT REMOVAL: SHX6384

## 2014-06-14 SURGERY — ESOPHAGOGASTRODUODENOSCOPY (EGD) WITH PROPOFOL
Anesthesia: Monitor Anesthesia Care

## 2014-06-14 MED ORDER — PROPOFOL 10 MG/ML IV BOLUS
INTRAVENOUS | Status: DC | PRN
  Administered 2014-06-14 (×4): 20 mg via INTRAVENOUS

## 2014-06-14 MED ORDER — LIDOCAINE HCL (CARDIAC) 20 MG/ML IV SOLN
INTRAVENOUS | Status: AC
  Filled 2014-06-14: qty 5

## 2014-06-14 MED ORDER — MIDAZOLAM HCL 5 MG/5ML IJ SOLN
INTRAMUSCULAR | Status: DC | PRN
  Administered 2014-06-14: 2 mg via INTRAVENOUS

## 2014-06-14 MED ORDER — PROPOFOL INFUSION 10 MG/ML OPTIME
INTRAVENOUS | Status: DC | PRN
  Administered 2014-06-14: 150 ug/kg/min via INTRAVENOUS

## 2014-06-14 MED ORDER — LACTATED RINGERS IV SOLN
INTRAVENOUS | Status: DC | PRN
  Administered 2014-06-14: 10:00:00 via INTRAVENOUS

## 2014-06-14 MED ORDER — ONDANSETRON HCL 4 MG/2ML IJ SOLN
INTRAMUSCULAR | Status: DC | PRN
  Administered 2014-06-14: 4 mg via INTRAVENOUS

## 2014-06-14 MED ORDER — PROPOFOL 10 MG/ML IV BOLUS
INTRAVENOUS | Status: AC
  Filled 2014-06-14: qty 20

## 2014-06-14 MED ORDER — MIDAZOLAM HCL 2 MG/2ML IJ SOLN
INTRAMUSCULAR | Status: AC
  Filled 2014-06-14: qty 2

## 2014-06-14 MED ORDER — KETAMINE HCL 10 MG/ML IJ SOLN
INTRAMUSCULAR | Status: DC | PRN
  Administered 2014-06-14: 20 mg via INTRAVENOUS

## 2014-06-14 MED ORDER — KETAMINE HCL 10 MG/ML IJ SOLN
INTRAMUSCULAR | Status: AC
  Filled 2014-06-14: qty 1

## 2014-06-14 SURGICAL SUPPLY — 14 items

## 2014-06-14 NOTE — Anesthesia Postprocedure Evaluation (Signed)
  Anesthesia Post-op Note  Patient: Kimberly Sandoval  Procedure(s) Performed: Procedure(s) (LRB): ESOPHAGOGASTRODUODENOSCOPY (EGD) WITH PROPOFOL (N/A) GASTROINTESTINAL STENT REMOVAL (N/A)  Patient Location: PACU  Anesthesia Type: MAC  Level of Consciousness: awake and alert   Airway and Oxygen Therapy: Patient Spontanous Breathing  Post-op Pain: mild  Post-op Assessment: Post-op Vital signs reviewed, Patient's Cardiovascular Status Stable, Respiratory Function Stable, Patent Airway and No signs of Nausea or vomiting  Last Vitals:  Filed Vitals:   06/14/14 1210  BP: 105/52  Pulse: 78  Temp:   Resp: 13    Post-op Vital Signs: stable   Complications: No apparent anesthesia complications

## 2014-06-14 NOTE — Op Note (Signed)
[facility] [address] [place], [id number]   ENDOSCOPY PROCEDURE REPORT  PATIENT: Kimberly Sandoval, Kimberly Sandoval  MR#: #[id number] BIRTHDATE: 1976-03-31 , 38  yrs. old GENDER: female ENDOSCOPIST: Vida RiggerMarc Zoye Chandra, MD REFERRED BY: PROCEDURE DATE:  06/14/2014 PROCEDURE:  EGD w/ fb removal ASA CLASS:     Class II INDICATIONS:  therapeutic procedure. MEDICATIONS: Propofol 300 mg IV and Versed 2 mg IV  ketamine 25 mg TOPICAL ANESTHETIC: Cetacaine Spray  DESCRIPTION OF PROCEDURE: After the risks benefits and alternatives of the procedure were thoroughly explained, informed consent was obtained.  The pentax ercp [id number] endoscope was introduced through the mouth and advanced to the second portion of the duodenum , Without limitations.  The instrument was slowly withdrawn as the mucosa was fully examined.    no obvious abnormalities were seen and the stent was found in the proper position in the ampulla and was grabbed with the snare in the customary fashion and was withdrawn into the scope but when it was partially pulled in the stent broke but the remaining part was secured using the elevator and the scope was removed and the 2 pieces of the stent was recovered and we went ahead and repeated the endoscopy using the side-viewing scope and other than some minimal trauma no other abnormalities were seen and the ampulla was normal       Retroflexed views revealed no abnormalities on quick evaluation.     The scope was then withdrawn from the patient and the procedure completed.  COMPLICATIONS: none ENDOSCOPIC IMPRESSION: 1.     previously placed biliary stent removed with snare as above without any obvious endoscopic findings nor obvious residual stent remaining despite breaking as above  RECOMMENDATIONS:customary post-endoscopic orders and happy to see back when necessary and back to normal activities and diet tomorrow and will begin liquids this afternoon and soft solids later  today if okay  REPEAT EXAM: as needed  eSigned:  Vida RiggerMarc Kirby Argueta, MD 06/14/2014 11:43 AM    CC:  CPT CODES: ICD CODES:  The ICD and CPT codes recommended by this software are interpretations from the data that the clinical staff has captured with the software.  The verification of the translation of this report to the ICD and CPT codes and modifiers is the sole responsibility of the health care institution and practicing physician where this report was generated.  [employer] will not be held responsible for the validity of the ICD and CPT codes included on this report.  AMA assumes no liability for data contained or not contained herein. CPT is a Publishing rights managerregistered trademark of the [employer].

## 2014-06-14 NOTE — Addendum Note (Signed)
Addended byVida Rigger: Zian Mohamed on: 06/14/2014 10:42 AM   Modules accepted: Orders

## 2014-06-14 NOTE — Discharge Instructions (Addendum)
Esophagogastroduodenoscopy Care After Refer to this sheet in the next few weeks. These instructions provide you with information on caring for yourself after your procedure. Your caregiver may also give you more specific instructions. Your treatment has been planned according to current medical practices, but problems sometimes occur. Call your caregiver if you have any problems or questions after your procedure.   HOME CARE INSTRUCTIONS  Do not eat or drink anything until the numbing medicine (local anesthetic) has worn off and your gag reflex has returned. You will know that the local anesthetic has worn off when you can swallow comfortably.  Do not drive for 12 hours after the procedure or as directed by your caregiver.  Only take medicines as directed by your caregiver.   SEEK MEDICAL CARE IF:   You cannot stop coughing.  You are not urinating at all or less than usual.  SEEK IMMEDIATE MEDICAL CARE IF:  You have difficulty swallowing.  You cannot eat or drink.  You have worsening throat or chest pain.  You have dizziness, lightheadedness, or you faint.  You have nausea or vomiting.  You have chills.  You have a fever.  You have severe abdominal pain.  You have black, tarry, or bloody stools. Liquids only until 3:30 PM and then may have soft solids and then if doing well may advance diet tomorrow and return to normal activity tomorrow and call if any of the above or if you have any other GI question or problem

## 2014-06-14 NOTE — Transfer of Care (Signed)
Immediate Anesthesia Transfer of Care Note  Patient: Kimberly Sandoval  Procedure(s) Performed: Procedure(s): ESOPHAGOGASTRODUODENOSCOPY (EGD) WITH PROPOFOL (N/A) GASTROINTESTINAL STENT REMOVAL (N/A)  Patient Location: PACU and Endoscopy Unit  Anesthesia Type:MAC  Level of Consciousness: awake, alert , oriented and patient cooperative  Airway & Oxygen Therapy: Patient Spontanous Breathing and Patient connected to nasal cannula oxygen  Post-op Assessment: Report given to PACU RN, Post -op Vital signs reviewed and stable and Patient moving all extremities  Post vital signs: Reviewed and stable  Complications: No apparent anesthesia complications

## 2014-06-14 NOTE — Progress Notes (Signed)
Jennings BooksVictoria Taras 10:15 AM  Subjective: Patient without any new GI symptoms and no problems since I seen her and has not been back to any other doctors  Objective: Vital signs stable afebrile no acute distress please see preassessment evaluation for her exam  Assessment: Stent placement status post bile leak  Plan: Okay to proceed with endoscopy and stent removal with anesthesia assistance and the procedure and the warnings were discussed including what to watch for at home after the procedure  [facility],Debara Kamphuis E

## 2014-06-15 ENCOUNTER — Encounter (HOSPITAL_COMMUNITY): Payer: Self-pay | Admitting: Gastroenterology

## 2014-10-03 ENCOUNTER — Other Ambulatory Visit: Payer: Self-pay | Admitting: Gastroenterology

## 2014-10-03 DIAGNOSIS — R635 Abnormal weight gain: Secondary | ICD-10-CM

## 2014-10-03 DIAGNOSIS — R109 Unspecified abdominal pain: Secondary | ICD-10-CM

## 2014-10-10 ENCOUNTER — Other Ambulatory Visit: Payer: Self-pay

## 2015-04-03 IMAGING — RF DG ERCP WO/W SPHINCTEROTOMY
1 series · 1 of 1 positions shown · non-contrast
Comparison: None.

CLINICAL DATA: Common bile duct leak

EXAM:
ERCP
TECHNIQUE: Multiple spot images obtained with the fluoroscopic device and
submitted for interpretation post-procedure.

[Series 1: run · 1 of 1 slices shown]
[im 1/1]
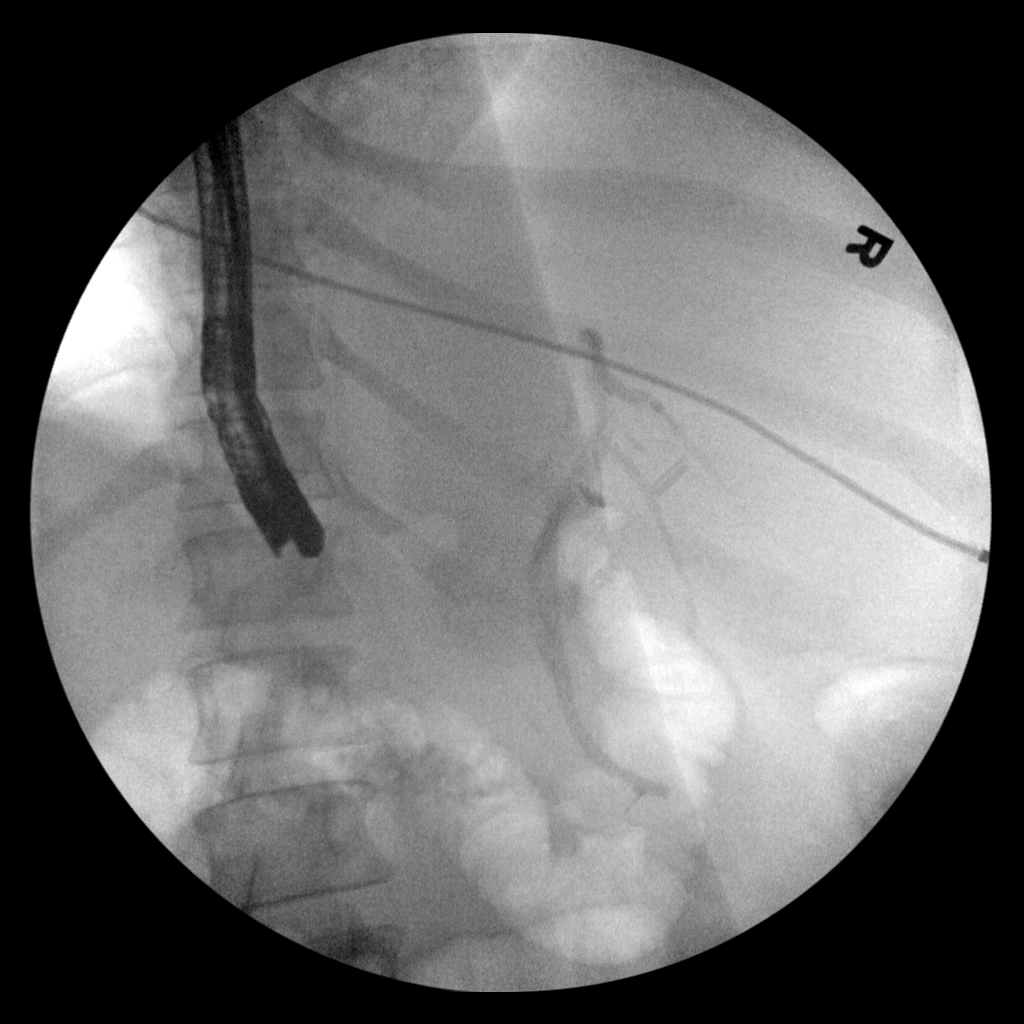

[1 of 1 positions shown; findings below may reference images not displayed]

FINDINGS: Images document cannulation of the common bile duct and placement of
a common bile duct stent.
IMPRESSION: See above.

These images were submitted for radiologic interpretation only.
Please see the procedural report for the amount of contrast and the
fluoroscopy time utilized.

## 2015-04-03 IMAGING — CR DG CHEST 2V
2 series · 2 of 2 positions shown · non-contrast
Comparison: None.

CLINICAL DATA: Hypoxia.

EXAM:
CHEST  2 VIEW

[x chest ap]
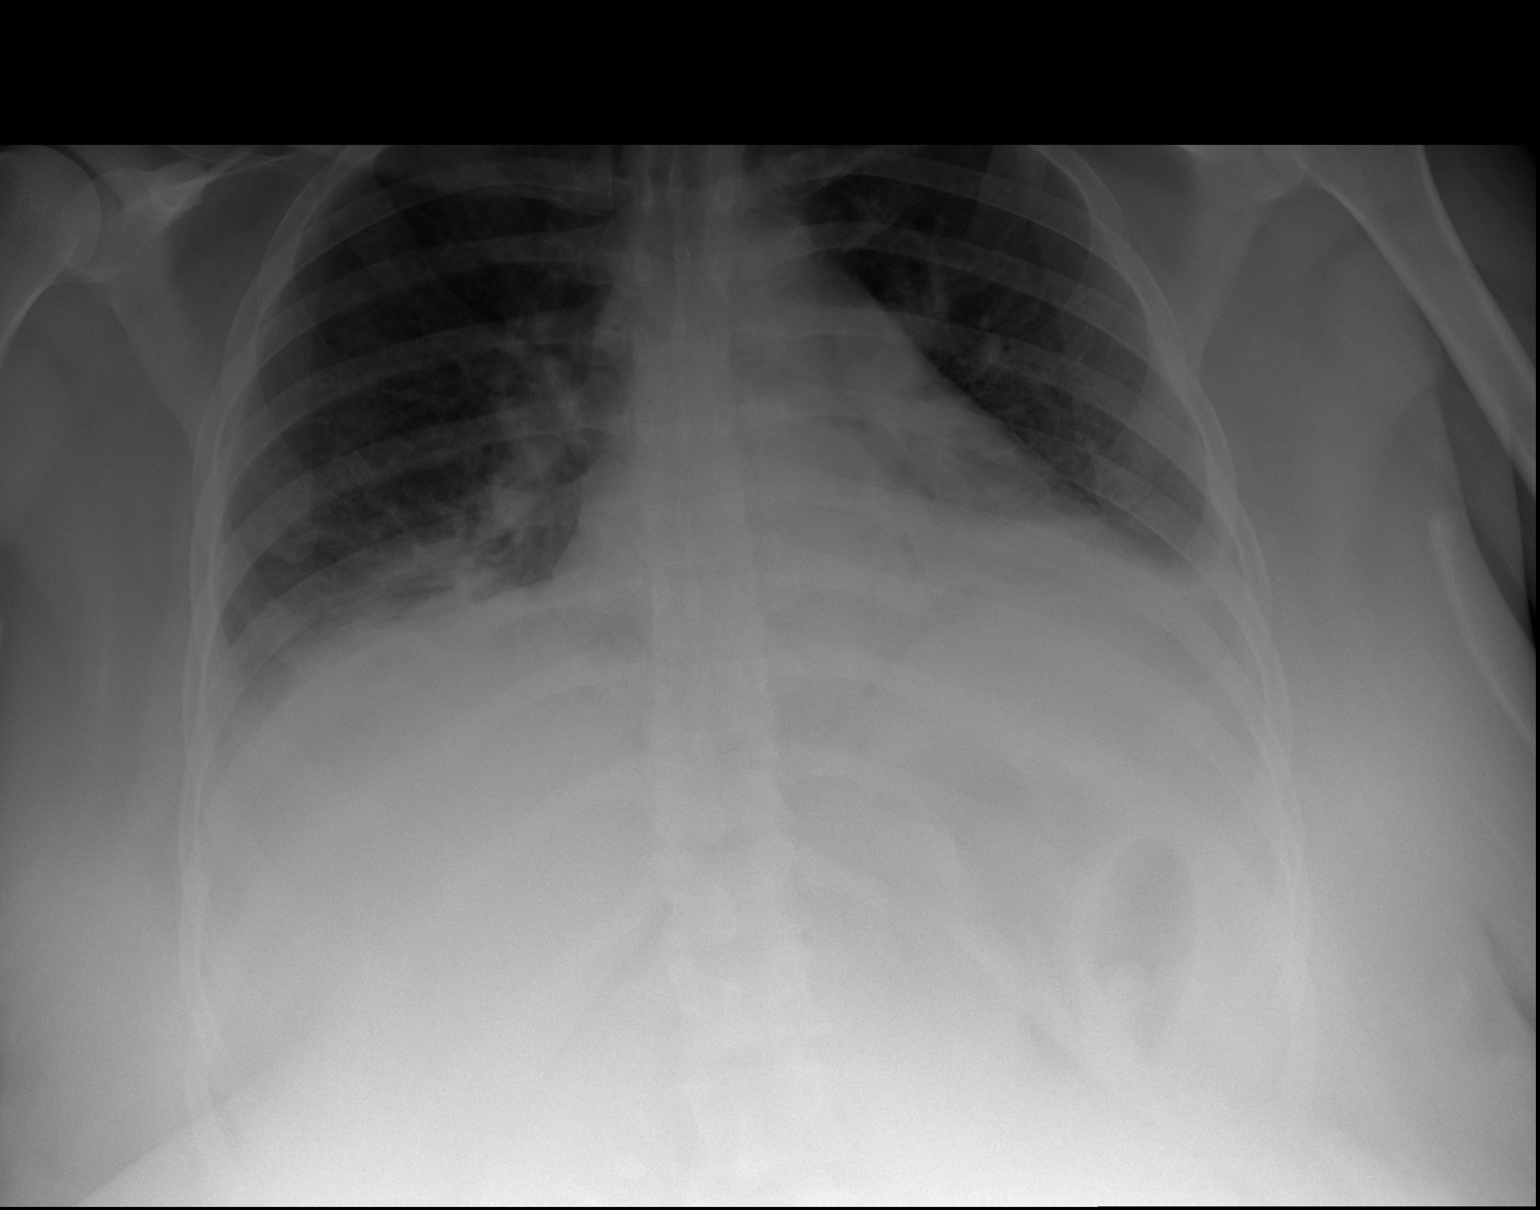

[w chest lat]
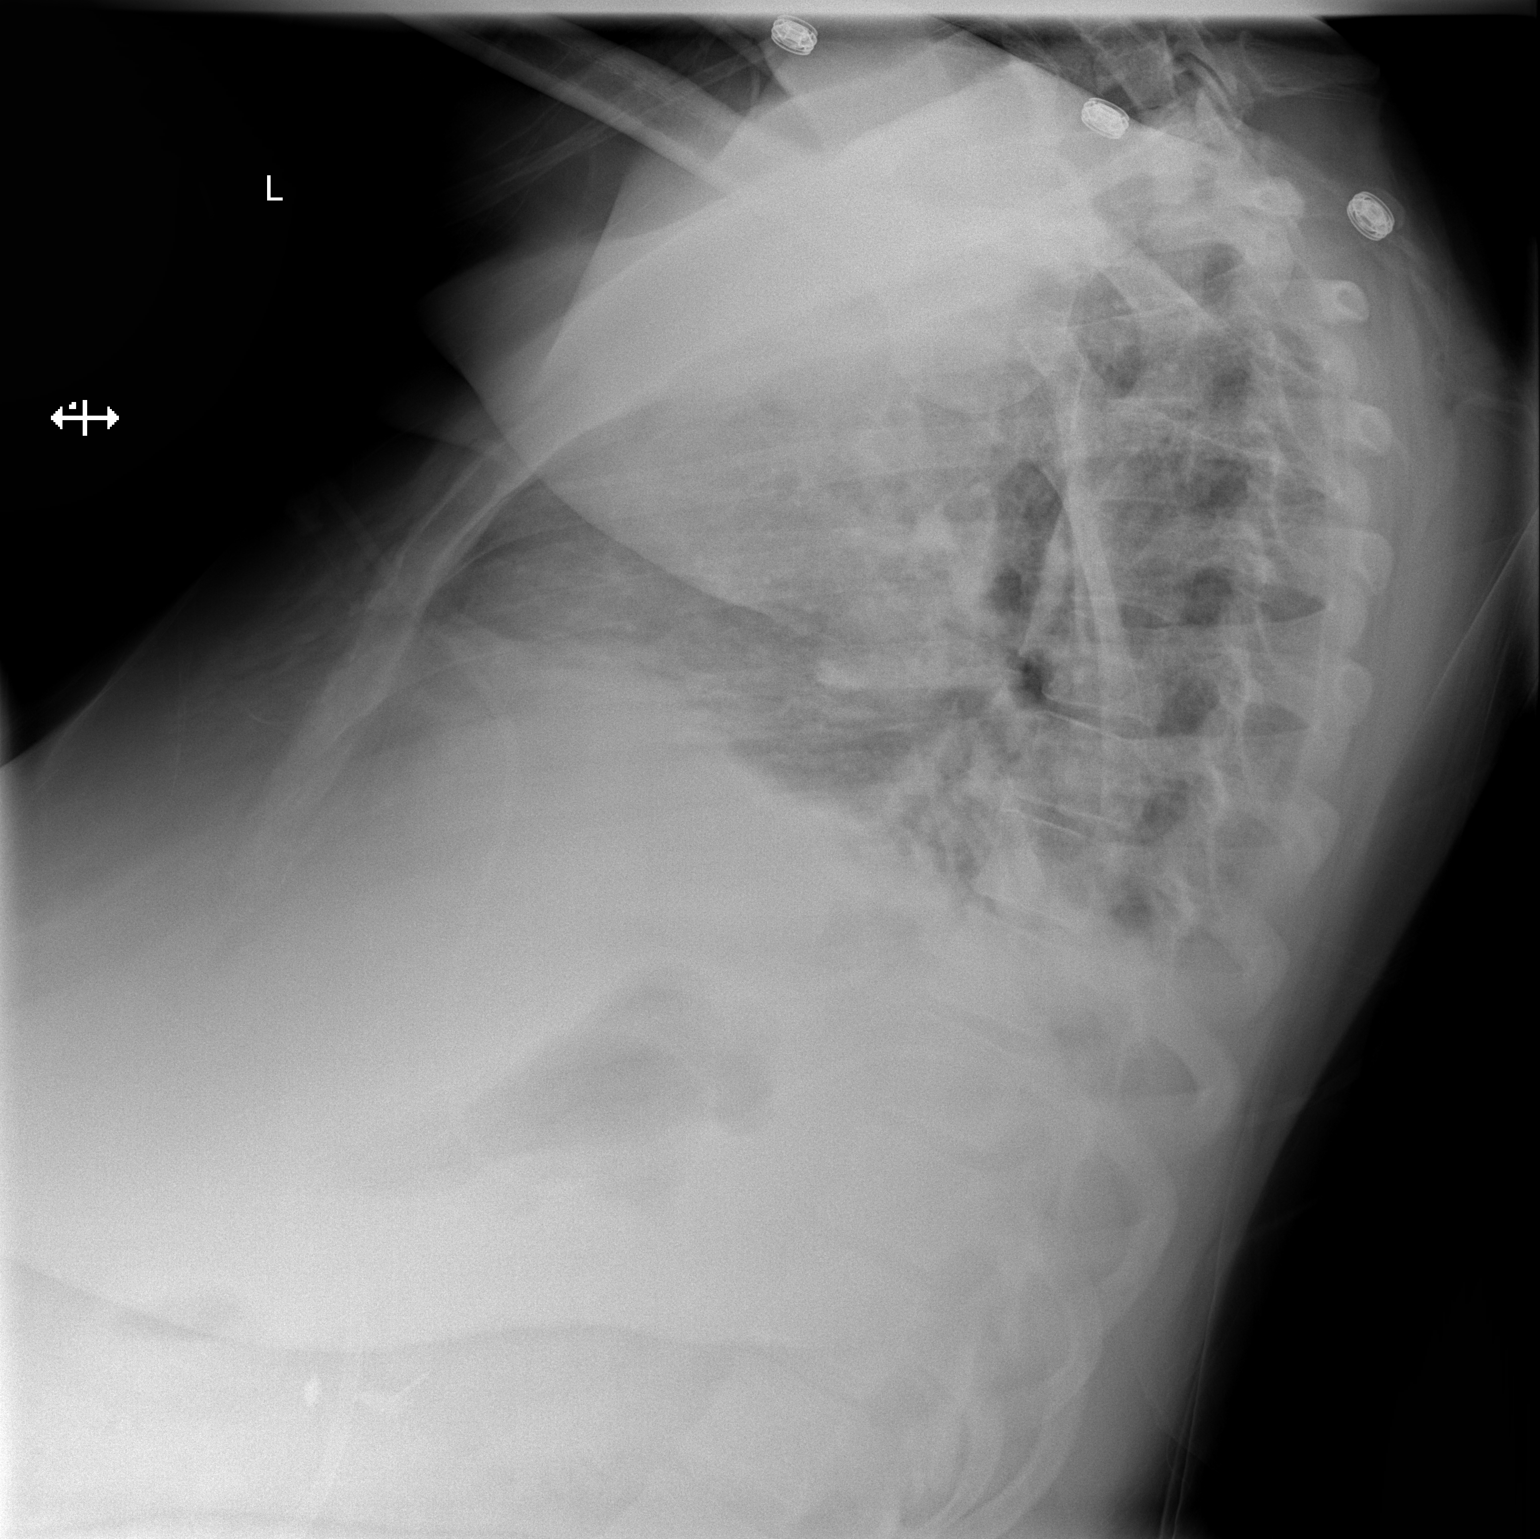

[2 of 2 positions shown; findings below may reference images not displayed]

FINDINGS: Low lung volumes. There is lung base opacity most likely
atelectasis. Infiltrate is possible. There is no pulmonary edema or
convincing pleural effusion. No pneumothorax.

Cardiac silhouette is normal in size. Normal mediastinal and hilar
contours.

Bony thorax is unremarkable.
IMPRESSION: Low lung volumes with bilateral lung base opacity, most likely
atelectasis. Infiltrate should be considered likely if there are
symptoms of pneumonia. No pulmonary edema.
